# Patient Record
Sex: Male | Born: 1980 | Race: White | Hispanic: No | Marital: Married | Smoking: Current every day smoker
Health system: Southern US, Community
[De-identification: ages and names within clinical notes are randomized; demographics above are authoritative.]

---

## 2019-08-30 ENCOUNTER — Emergency Department (HOSPITAL_COMMUNITY): Payer: 59

## 2019-08-30 ENCOUNTER — Other Ambulatory Visit: Payer: Self-pay

## 2019-08-30 ENCOUNTER — Encounter (HOSPITAL_COMMUNITY): Payer: Self-pay

## 2019-08-30 ENCOUNTER — Emergency Department (HOSPITAL_COMMUNITY)
Admission: EM | Admit: 2019-08-30 | Discharge: 2019-08-30 | Disposition: A | Discharge status: Home / Self Care | Payer: 59 | Attending: Emergency Medicine | Admitting: Emergency Medicine

## 2019-08-30 DIAGNOSIS — Z20822 Contact with and (suspected) exposure to covid-19: Secondary | ICD-10-CM | POA: Diagnosis not present

## 2019-08-30 DIAGNOSIS — F17210 Nicotine dependence, cigarettes, uncomplicated: Secondary | ICD-10-CM | POA: Diagnosis not present

## 2019-08-30 DIAGNOSIS — R079 Chest pain, unspecified: Secondary | ICD-10-CM | POA: Diagnosis present

## 2019-08-30 DIAGNOSIS — R0602 Shortness of breath: Secondary | ICD-10-CM | POA: Diagnosis not present

## 2019-08-30 DIAGNOSIS — R072 Precordial pain: Secondary | ICD-10-CM | POA: Insufficient documentation

## 2019-08-30 LAB — BASIC METABOLIC PANEL
Anion gap: 16 — ABNORMAL HIGH (ref 5–15)
BUN: 22 mg/dL — ABNORMAL HIGH (ref 6–20)
CO2: 20 mmol/L — ABNORMAL LOW (ref 22–32)
Calcium: 9.9 mg/dL (ref 8.9–10.3)
Chloride: 99 mmol/L (ref 98–111)
Creatinine, Ser: 1.43 mg/dL — ABNORMAL HIGH (ref 0.61–1.24)
GFR calc Af Amer: >60 mL/min (ref >60)
GFR calc non Af Amer: >60 mL/min (ref >60)
Glucose, Bld: 95 mg/dL (ref 70–99)
Potassium: 3.1 mmol/L — ABNORMAL LOW (ref 3.5–5.1)
Sodium: 135 mmol/L (ref 135–145)

## 2019-08-30 LAB — TROPONIN I (HIGH SENSITIVITY)
Troponin I (High Sensitivity): 3 ng/L (ref <18)
Troponin I (High Sensitivity): 2 ng/L (ref <18)
Troponin I (High Sensitivity): 3 ng/L (ref <18)

## 2019-08-30 LAB — CBC
HCT: 43.4 % (ref 39.0–52.0)
Hemoglobin: 15.4 g/dL (ref 13.0–17.0)
MCH: 31 pg (ref 26.0–34.0)
MCHC: 35.5 g/dL (ref 30.0–36.0)
MCV: 87.5 fL (ref 80.0–100.0)
Platelets: 322 10*3/uL (ref 150–400)
RBC: 4.96 MIL/uL (ref 4.22–5.81)
RDW: 11.9 % (ref 11.5–15.5)
WBC: 9 10*3/uL (ref 4.0–10.5)
nRBC: 0 % (ref 0.0–0.2)

## 2019-08-30 LAB — D-DIMER, QUANTITATIVE: D-Dimer, Quant: 0.62 ug/mL-FEU — ABNORMAL HIGH (ref 0.00–0.50)

## 2019-08-30 LAB — MAGNESIUM: Magnesium: 1.8 mg/dL (ref 1.7–2.4)

## 2019-08-30 LAB — SARS CORONAVIRUS 2 BY RT PCR (HOSPITAL ORDER, PERFORMED IN ~~LOC~~ HOSPITAL LAB): SARS Coronavirus 2: NEGATIVE

## 2019-08-30 MED ORDER — POTASSIUM CHLORIDE 10 MEQ/100ML IV SOLN
10.0000 meq | INTRAVENOUS | Status: AC
  Administered 2019-08-30 (×2): 10 meq via INTRAVENOUS
  Filled 2019-08-30 (×3): qty 100

## 2019-08-30 MED ORDER — LORAZEPAM 2 MG/ML IJ SOLN
1.0000 mg | Freq: Once | INTRAMUSCULAR | Status: AC
  Administered 2019-08-30: 1 mg via INTRAVENOUS
  Filled 2019-08-30: qty 1

## 2019-08-30 MED ORDER — HYDROXYZINE HCL 25 MG PO TABS: 25.0000 mg | ORAL_TABLET | Freq: Four times a day (QID) | ORAL | 0 refills | Status: AC

## 2019-08-30 MED ORDER — SODIUM CHLORIDE 0.9% FLUSH
3.0000 mL | Freq: Once | INTRAVENOUS | Status: AC
  Administered 2019-08-30: 3 mL via INTRAVENOUS

## 2019-08-30 MED ORDER — MAGNESIUM SULFATE 2 GM/50ML IV SOLN
2.0000 g | Freq: Once | INTRAVENOUS | Status: AC
  Administered 2019-08-30: 2 g via INTRAVENOUS
  Filled 2019-08-30: qty 50

## 2019-08-30 MED ORDER — SODIUM CHLORIDE 0.9 % IV BOLUS
1000.0000 mL | Freq: Once | INTRAVENOUS | Status: AC
  Administered 2019-08-30: 1000 mL via INTRAVENOUS

## 2019-08-30 MED ORDER — LORAZEPAM 1 MG PO TABS: 1.0000 mg | ORAL_TABLET | Freq: Once | ORAL | Status: DC

## 2019-08-30 MED ORDER — NITROGLYCERIN 0.4 MG SL SUBL
0.4000 mg | SUBLINGUAL_TABLET | Freq: Once | SUBLINGUAL | Status: AC
  Administered 2019-08-30: 0.4 mg via SUBLINGUAL
  Filled 2019-08-30: qty 1

## 2019-08-30 MED ORDER — SODIUM CHLORIDE (PF) 0.9 % IJ SOLN
INTRAMUSCULAR | Status: AC
  Filled 2019-08-30: qty 50

## 2019-08-30 MED ORDER — ASPIRIN 81 MG PO CHEW
324.0000 mg | CHEWABLE_TABLET | Freq: Once | ORAL | Status: AC
  Administered 2019-08-30: 324 mg via ORAL
  Filled 2019-08-30: qty 4

## 2019-08-30 MED ORDER — ALBUTEROL SULFATE HFA 108 (90 BASE) MCG/ACT IN AERS
2.0000 | INHALATION_SPRAY | Freq: Once | RESPIRATORY_TRACT | Status: AC
  Administered 2019-08-30: 2 via RESPIRATORY_TRACT
  Filled 2019-08-30: qty 6.7

## 2019-08-30 MED ORDER — IOHEXOL 350 MG/ML SOLN
100.0000 mL | Freq: Once | INTRAVENOUS | Status: AC | PRN
  Administered 2019-08-30: 100 mL via INTRAVENOUS

## 2019-08-30 MED ORDER — FENTANYL CITRATE (PF) 100 MCG/2ML IJ SOLN
50.0000 ug | Freq: Once | INTRAMUSCULAR | Status: AC
  Administered 2019-08-30: 50 ug via INTRAVENOUS
  Filled 2019-08-30: qty 2

## 2019-08-30 MED ORDER — LIDOCAINE VISCOUS HCL 2 % MT SOLN
15.0000 mL | Freq: Once | OROMUCOSAL | Status: AC
  Administered 2019-08-30: 15 mL via ORAL
  Filled 2019-08-30: qty 15

## 2019-08-30 MED ORDER — ALUM & MAG HYDROXIDE-SIMETH 200-200-20 MG/5ML PO SUSP
30.0000 mL | Freq: Once | ORAL | Status: AC
  Administered 2019-08-30: 30 mL via ORAL
  Filled 2019-08-30: qty 30

## 2019-08-30 NOTE — ED Notes (Addendum)
Called lab regarding troponin collected at 11:45 am. Lab said it was not received. I insisted that I sent the lab. After 3 minutes of conversation regarding the labs I send that are not received, representative said let me look in the save rack, and the 11:45 AM troponin was found in the save rack.

## 2019-08-30 NOTE — ED Triage Notes (Signed)
Pt presents with c/o chest pain that started this morning on the way to work. Pt reports he is having a hard time catching his breath and feels heaviness in his chest. Pt also reports trouble swallowing.

## 2019-08-30 NOTE — ED Notes (Signed)
Pt. Aware of urine specimen. Urinal at the bedside. Will collect urine when pt. Voids. Nurse aware. 

## 2019-08-30 NOTE — Discharge Instructions (Signed)
Return for new or worsening symptoms

## 2019-08-30 NOTE — ED Notes (Signed)
Patient transported to CT 

## 2019-08-30 NOTE — ED Provider Notes (Signed)
[facility]-EMERGENCY DEPT Provider Note   CSN: [id number] Arrival date & time: 08/30/19  8850    History Chief Complaint  Patient presents with  . Chest Pain    Alexander Fields is a 39 y.o. male with past medical history significant for tobacco use, anxiety who presents for evaluation of chest pain.  Patient states this morning he woke up and had chest pain.  States occasionally radiate into his back.  States he also had some myalgias and difficulty swallowing.  Takes Prozac for anxiety which she states he did take this morning.  He does use tobacco.  No associated diaphoresis, nausea, vomiting, radiation to left arm, jaw.  He does feel short of breath.  No headache, hemoptysis, unilateral leg swelling, redness, warmth, abdominal pain, diarrhea, dysuria.  No recent surgery, malignancy, immobilization.  No prior history of clotting disorders, aneurysms, PE, dissection.  Rates his pain a 10/10.  Denies of aggravating or relieving factors.  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 NO pleuritic CP.  Denies illicit substance use specifically Cocaine use  History obtained from patient and past medical records. No interpretor was used.   1000: Attempted to contact patients wife Junie Spencer per patients request at [phone] however rang without voicemail answer.  HPI     History reviewed. No pertinent past medical history.  There are no problems to display for this patient.   History reviewed. No pertinent surgical history.     No family history on file.  Social History   Tobacco Use  . Smoking status: Current Every Day Smoker    Packs/day: 0.50    Types: Cigarettes  . Smokeless tobacco: Never Used  Substance Use Topics  . Alcohol use: Never  . Drug use: Never    Home Medications Prior to Admission medications   Medication Sig Start Date End Date Taking? Authorizing Provider  FLUoxetine (PROZAC) 40 MG  capsule Take 80 mg by mouth in the morning. 08/20/19  Yes [provider]  hydrOXYzine (ATARAX/VISTARIL) 25 MG tablet Take 1 tablet (25 mg total) by mouth every 6 (six) hours. 08/30/19   Romar Woodrick A, PA-C    Allergies    Penicillins and Tramadol  Review of Systems   Review of Systems  Constitutional: Negative.   HENT: Negative.   Respiratory: Positive for shortness of breath. Negative for apnea, cough, choking, chest tightness, wheezing and stridor.   Cardiovascular: Positive for chest pain. Negative for palpitations and leg swelling.  Gastrointestinal: Negative.   Genitourinary: Negative.   Musculoskeletal: Negative.   Skin: Negative.   Neurological: Negative.   All other systems reviewed and are negative.  Physical Exam Updated Vital Signs BP 116/74   Pulse 99   Temp 97.6 F (36.4 C)   Resp 17   Ht 5\' 9"  (1.753 m)   Wt 72.1 kg   SpO2 100%   BMI 23.48 kg/m   Physical Exam Vitals and nursing note reviewed.  Constitutional:      General: He is not in acute distress.    Appearance: He is not ill-appearing, toxic-appearing or diaphoretic.     Comments: Appear anxious  HENT:     Head: Normocephalic and atraumatic.     Jaw: There is normal jaw occlusion.     Nose: Nose normal.     Mouth/Throat:     Mouth: Mucous membranes are moist.  Eyes:     Extraocular Movements: Extraocular movements intact.  Neck:  Vascular: No carotid bruit or JVD.     Trachea: Trachea and phonation normal.     Meningeal: Brudzinski's sign and Kernig's sign absent.  Cardiovascular:     Rate and Rhythm: Tachycardia present.     Pulses: Normal pulses.          Radial pulses are 2+ on the right side and 2+ on the left side.       Posterior tibial pulses are 2+ on the right side and 2+ on the left side.     Heart sounds: Normal heart sounds.     Comments: NO murmur Pulmonary:     Effort: Pulmonary effort is normal. Tachypnea present.     Breath sounds: Normal breath sounds and  air entry.     Comments: Intermittent tachypnic. Clear lung sounds, speaks in full sentences without difficulty  Chest:     Chest wall: No deformity, swelling, tenderness, crepitus or edema.     Comments: Equal rise and fall of chest. No crepitus step off or skin changes Abdominal:     General: Bowel sounds are normal. There is no distension.     Palpations: Abdomen is soft.     Tenderness: There is no abdominal tenderness. There is no right CVA tenderness, left CVA tenderness, guarding or rebound.     Comments: Soft non tender without rebound or guarding  Musculoskeletal:        General: Normal range of motion.     Cervical back: Full passive range of motion without pain, normal range of motion and neck supple.     Right lower leg: Normal. No tenderness. No edema.     Left lower leg: Normal. No tenderness. No edema.     Comments: Moves all 4 extremities without difficulty. Compartments soft. Homans sign negative.  Feet:     Right foot:     Skin integrity: Skin integrity normal.     Left foot:     Skin integrity: Skin integrity normal.  Lymphadenopathy:     Cervical: No cervical adenopathy.  Skin:    General: Skin is warm.     Capillary Refill: Capillary refill takes less than 2 seconds.     Findings: No erythema.     Comments: Brisk cap refill. No edema, erythema, warmth  Neurological:     General: No focal deficit present.     Mental Status: He is alert and oriented to person, place, and time.     Cranial Nerves: Cranial nerves are intact.     Sensory: Sensation is intact.     Motor: Motor function is intact.     Coordination: Coordination is intact.     Gait: Gait is intact.     Deep Tendon Reflexes: Reflexes are normal and symmetric.     Comments: Ambulatory without ataxic gait. CN 2-12 grossly intact. AxO x 4     ED Results / Procedures / Treatments   Labs (all labs ordered are listed, but only abnormal results are displayed) Labs Reviewed  BASIC METABOLIC PANEL -  Abnormal; Notable for the following components:      Result Value   Potassium 3.1 (*)    CO2 20 (*)    BUN 22 (*)    Creatinine, Ser 1.43 (*)    Anion gap 16 (*)    All other components within normal limits  D-DIMER, QUANTITATIVE (NOT AT [facility]) - Abnormal; Notable for the following components:   D-Dimer, Quant 0.62 (*)    All other components within  normal limits  SARS CORONAVIRUS 2 BY RT PCR (HOSPITAL ORDER, PERFORMED IN [facility])  CBC  MAGNESIUM  RAPID URINE DRUG SCREEN, HOSP PERFORMED  TROPONIN I (HIGH SENSITIVITY)  TROPONIN I (HIGH SENSITIVITY)  TROPONIN I (HIGH SENSITIVITY)  TROPONIN I (HIGH SENSITIVITY)    EKG EKG Interpretation  Date/Time:  Sunday August 30 2019 11:40:38 EDT Ventricular Rate:  75 PR Interval:    QRS Duration: 107 QT Interval:  447 QTC Calculation: 500 R Axis:   46 Text Interpretation: Sinus rhythm RSR' in V1 or V2, right VCD or RVH Borderline prolonged QT interval Since last tracing of earlier today rate slower, ST segments normal, and  QT is shorter Otherwise no significant change Confirmed by Mancel Bale [phone]) on 08/30/2019 12:23:59 PM   Radiology DG Chest 2 View  Result Date: 08/30/2019 CLINICAL DATA:  Onset shortness of breath and chest pain extending into back since this morning. EXAM: CHEST - 2 VIEW COMPARISON:  03/07/2017 FINDINGS: Lungs are clear. Heart size and mediastinal contours are within normal limits. No effusion.  No pleural effusion. Visualized bones unremarkable. IMPRESSION: No acute cardiopulmonary disease. Electronically Signed   By: Corlis Leak M.D.   On: 08/30/2019 09:30   CT Angio Chest/Abd/Pel for Dissection W and/or W/WO  Result Date: 08/30/2019 CLINICAL DATA:  Severe left-sided chest pain and shortness of breath, aortic dissection suspected EXAM: CT ANGIOGRAPHY CHEST, ABDOMEN AND PELVIS TECHNIQUE: Non-contrast CT of the chest was initially obtained. Multidetector CT imaging through the chest, abdomen and pelvis  was performed using the standard protocol during bolus administration of intravenous contrast. Multiplanar reconstructed images and MIPs were obtained and reviewed to evaluate the vascular anatomy. CONTRAST:  OMNIPAQUE IOHEXOL 350 MG/ML SOLN COMPARISON:  None. FINDINGS: CTA CHEST FINDINGS Cardiovascular: Preferential opacification of the thoracic aorta. Normal contour and caliber of the thoracic aorta. No evidence of aneurysm, dissection, or other acute aortic pathology. Normal heart size. No pericardial effusion. Mediastinum/Nodes: No enlarged mediastinal, hilar, or axillary lymph nodes. Thyroid gland, trachea, and esophagus demonstrate no significant findings. Lungs/Pleura: Lungs are clear. No pleural effusion or pneumothorax. Musculoskeletal: No chest wall abnormality. No acute or significant osseous findings. Review of the MIP images confirms the above findings. CTA ABDOMEN AND PELVIS FINDINGS VASCULAR Normal contour and caliber of the abdominal aorta. No evidence of aneurysm, dissection, or other acute aortic pathology. 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 Standard branching pattern of the abdominal aorta with solitary bilateral renal arteries. Review of the MIP images confirms the above findings. NON-VASCULAR Hepatobiliary: No solid liver abnormality is seen. No gallstones, gallbladder wall thickening, or biliary dilatation. Pancreas: Unremarkable. No pancreatic ductal dilatation or surrounding inflammatory changes. Spleen: Normal in size without significant abnormality. Adrenals/Urinary Tract: Adrenal glands are unremarkable. Kidneys are normal, without renal calculi, solid lesion, or hydronephrosis. Bladder is unremarkable. Stomach/Bowel: Stomach is within normal limits. Appendix appears normal. No evidence of bowel wall thickening, distention, or inflammatory changes. Lymphatic: No enlarged abdominal or pelvic lymph nodes. Reproductive: No mass or other significant abnormality.  Other: No abdominal wall hernia or abnormality. No abdominopelvic ascites. Musculoskeletal: No acute or significant osseous findings. Review of the MIP images confirms the above findings. IMPRESSION: 1. Normal contour and caliber of the thoracic and abdominal aorta. No evidence of aneurysm, dissection, or other acute aortic pathology. 2. 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 Aortic Atherosclerosis (ICD10-I70.0). Electronically Signed   By: Lauralyn Primes M.D.   On: 08/30/2019 10:55  Procedures Procedures (including critical care time)  Medications Ordered in ED Medications  sodium chloride (PF) 0.9 % injection (has no administration in time range)  sodium chloride flush (NS) 0.9 % injection 3 mL (3 mLs Intravenous Given 08/30/19 0943)  aspirin chewable tablet 324 mg (324 mg Oral Given 08/30/19 0943)  sodium chloride 0.9 % bolus 1,000 mL (0 mLs Intravenous Stopped 08/30/19 1054)  LORazepam (ATIVAN) injection 1 mg (1 mg Intravenous Given 08/30/19 1028)  fentaNYL (SUBLIMAZE) injection 50 mcg (50 mcg Intravenous Given 08/30/19 1027)  iohexol (OMNIPAQUE) 350 MG/ML injection 100 mL (100 mLs Intravenous Contrast Given 08/30/19 1030)  potassium chloride 10 mEq in 100 mL IVPB (0 mEq Intravenous Stopped 08/30/19 1315)  magnesium sulfate IVPB 2 g 50 mL (0 g Intravenous Stopped 08/30/19 1212)  albuterol (VENTOLIN HFA) 108 (90 Base) MCG/ACT inhaler 2 puff (2 puffs Inhalation Given 08/30/19 1214)  LORazepam (ATIVAN) injection 1 mg (1 mg Intravenous Given 08/30/19 1214)  nitroGLYCERIN (NITROSTAT) SL tablet 0.4 mg (0.4 mg Sublingual Given 08/30/19 1214)  alum & mag hydroxide-simeth (MAALOX/MYLANTA) 200-200-20 MG/5ML suspension 30 mL (30 mLs Oral Given 08/30/19 1310)    And  lidocaine (XYLOCAINE) 2 % viscous mouth solution 15 mL (15 mLs Oral Given 08/30/19 1310)   ED Course  I have reviewed the triage vital signs and the nursing notes.  Pertinent labs & imaging results  that were available during my care of the patient were reviewed by me and considered in my medical decision making (see chart for details).   39 year old male presents for evaluation of chest pain which began this morning.  He does appear anxious and has tachycardia, tachypnea however no hypoxia.  Occasionally radiate into back.  No new lateral leg swelling, redness or warmth.  Denna Haggard' sign is negative, clinically patient does not have evidence of DVT.  No upper respiratory complaints prior to this to suggest infectious process.  No recent injury or trauma.  No history of clotting disorders, PE, dissection or aneurysm.  Labs and imaging personally reviewed and interpreted: CBC without leukocytosis BMP with Hypokalemia at 3.1, will replace, Creatinine 1.43 no prior to compare, Anion gap 16 Ddimer 0.62 Trop 3, repeat 2 however not a true delta as this was collected 1 hours after initial. Nursing notified of recollect. Mag 1.8 however given hypokalemia and Prolonged Qtc on EKG will add on supplementation with potassium UDS pending >>> refuses to provide UA COVID pending DG chest without acute cardiopulmonary disease CTA Chest, Ab/Pelvis for dissection WO acute findings EKG without STEMI, repeat EKG without STEMI, no ST changes, Qtc improved from prior  Patient reassessed. Patient hyperventilating, still has pain. Will add on additional meds  Patient reassessed. No CP. RR 18. Lungs clear after albuterol. Patient does seem to have anxiety which seem to be contributing to his discomfort as he now says he has belching reflux and some generalized abd pain. Discussed CT findings. Repeat Abd exam benign. Low suspicion for acute surgical abdomen. DDimer was mildly evaluated previously however no large PE on Dissection study. Will give GI cocktail. Unfortunately lab has lost patients last 2 delta trop blood draws. Nursing has called down and they were able to located the 1145 delta trop which was flat at  3.  Patient appear comfortable. No current complaints. Does get anxious when people enter the room. No further tachycardia or tachypnia. No respiratory distress.  Patient reassessed, sleeping on reevaluation however arousable to voice.  He is ambulatory in room without difficult. Patient without any  current complaints.  He is ambulatory without any hypoxia, tachypnea.  Patient with reassuring work-up here in ED.  Low suspicion for PE, dissection, ACS, cardiac tamponade, pneumothorax, bacterial infectious process, borhaave. Symptoms resolved after additional Ativan and GI cocktail. Likly multifactorial in nature. I have attempted to contact Wife x 2 however unable to reach her. Patient dc home in stable condition.   Patient is to be discharged with recommendation to follow up with PCP in regards to today's hospital visit. Chest pain is not likely of cardiac or pulmonary etiology d/t presentation, PERC negative, VSS, no tracheal deviation, no JVD or new murmur, RRR, breath sounds equal bilaterally, EKG without acute abnormalities, negative troponin, and negative CXR. Pt has been advised to return to the ED if CP becomes exertional, associated with diaphoresis or nausea, radiates to left jaw/arm, worsens or becomes concerning in any way. Pt appears reliable for follow up and is agreeable to discharge.   The patient has been appropriately medically screened and/or stabilized in the ED. I have low suspicion for any other emergent medical condition which would require further screening, evaluation or treatment in the ED or require inpatient management.  Patient is hemodynamically stable and in no acute distress.  Patient able to ambulate in department prior to ED.  Evaluation does not show acute pathology that would require ongoing or additional emergent interventions while in the emergency department or further inpatient treatment.  I have discussed the diagnosis with the patient and answered all questions.   Pain is been managed while in the emergency department and patient has no further complaints prior to discharge.  Patient is comfortable with plan discussed in room and is stable for discharge at this time.  I have discussed strict return precautions for returning to the emergency department.  Patient was encouraged to follow-up with PCP/specialist refer to at discharge.  Patient seen and evaluated by attending Dr. Effie Shy who agrees with above treatment, plan and disposition.    MDM Rules/Calculators/A&P                           Final Clinical Impression(s) / ED Diagnoses Final diagnoses:  Precordial pain    Rx / DC Orders ED Discharge Orders         Ordered    hydrOXYzine (ATARAX/VISTARIL) 25 MG tablet  Every 6 hours     Discontinue  Reprint     08/30/19 1352           Clevester Helzer A, PA-C 08/30/19 1418    Mancel Bale, MD 08/30/19 1502

## 2019-08-30 NOTE — ED Provider Notes (Signed)
  Face-to-face evaluation   History: He presents for evaluation of sudden onset of center lower chest pain, as he was starting to work today.  He works as a Games developer.  On his way to work, while driving, he noticed some trouble breathing.  He denies fever, chills, cough, nausea vomiting.  He is a cigarette smoker.  Physical exam: Alert, anxious, tachypneic and tachycardic.  Heart-tachycardic without murmur.  Lungs clear anteriorly.  There is no increased work of breathing.  Medical screening examination/treatment/procedure(s) were conducted as a shared visit with non-physician practitioner(s) and myself.  I personally evaluated the patient during the encounter    Mancel Bale, MD 08/30/19 [id number]

## 2021-03-13 ENCOUNTER — Emergency Department (HOSPITAL_COMMUNITY): Payer: Self-pay

## 2021-03-13 ENCOUNTER — Emergency Department (HOSPITAL_COMMUNITY)
Admission: EM | Admit: 2021-03-13 | Discharge: 2021-03-13 | Disposition: A | Discharge status: Home / Self Care | Payer: Self-pay | Attending: Emergency Medicine | Admitting: Emergency Medicine

## 2021-03-13 DIAGNOSIS — R1011 Right upper quadrant pain: Secondary | ICD-10-CM | POA: Insufficient documentation

## 2021-03-13 DIAGNOSIS — R1032 Left lower quadrant pain: Secondary | ICD-10-CM | POA: Insufficient documentation

## 2021-03-13 LAB — URINALYSIS, ROUTINE W REFLEX MICROSCOPIC
Bilirubin Urine: NEGATIVE
Glucose, UA: NEGATIVE mg/dL
Ketones, ur: NEGATIVE mg/dL
Leukocytes,Ua: NEGATIVE
Nitrite: NEGATIVE
Protein, ur: NEGATIVE mg/dL
Specific Gravity, Urine: 1.025 (ref 1.005–1.030)
pH: 6.5 (ref 5.0–8.0)

## 2021-03-13 LAB — CBC
HCT: 47.1 % (ref 39.0–52.0)
Hemoglobin: 16.5 g/dL (ref 13.0–17.0)
MCH: 31.9 pg (ref 26.0–34.0)
MCHC: 35 g/dL (ref 30.0–36.0)
MCV: 91.1 fL (ref 80.0–100.0)
Platelets: 275 10*3/uL (ref 150–400)
RBC: 5.17 MIL/uL (ref 4.22–5.81)
RDW: 11.6 % (ref 11.5–15.5)
WBC: 7 10*3/uL (ref 4.0–10.5)
nRBC: 0 % (ref 0.0–0.2)

## 2021-03-13 LAB — COMPREHENSIVE METABOLIC PANEL
ALT: 23 U/L (ref 0–44)
AST: 21 U/L (ref 15–41)
Albumin: 4.3 g/dL (ref 3.5–5.0)
Alkaline Phosphatase: 76 U/L (ref 38–126)
Anion gap: 8 (ref 5–15)
BUN: 12 mg/dL (ref 6–20)
CO2: 27 mmol/L (ref 22–32)
Calcium: 9.6 mg/dL (ref 8.9–10.3)
Chloride: 103 mmol/L (ref 98–111)
Creatinine, Ser: 1.04 mg/dL (ref 0.61–1.24)
GFR, Estimated: >60 mL/min (ref >60)
Glucose, Bld: 101 mg/dL — ABNORMAL HIGH (ref 70–99)
Potassium: 4.6 mmol/L (ref 3.5–5.1)
Sodium: 138 mmol/L (ref 135–145)
Total Bilirubin: 0.3 mg/dL (ref 0.3–1.2)
Total Protein: 7.2 g/dL (ref 6.5–8.1)

## 2021-03-13 LAB — URINALYSIS, MICROSCOPIC (REFLEX): Squamous Epithelial / HPF: NONE SEEN (ref 0–5)

## 2021-03-13 LAB — LIPASE, BLOOD: Lipase: 30 U/L (ref 11–51)

## 2021-03-13 MED ORDER — FENTANYL CITRATE PF 50 MCG/ML IJ SOSY
50.0000 ug | PREFILLED_SYRINGE | Freq: Once | INTRAMUSCULAR | Status: AC
  Administered 2021-03-13: 50 ug via INTRAVENOUS
  Filled 2021-03-13: qty 1

## 2021-03-13 MED ORDER — NAPROXEN 375 MG PO TABS: 375.0000 mg | ORAL_TABLET | Freq: Two times a day (BID) | ORAL | 0 refills | Status: AC

## 2021-03-13 MED ORDER — HYDROMORPHONE HCL 1 MG/ML IJ SOLN
1.0000 mg | Freq: Once | INTRAMUSCULAR | Status: AC
  Administered 2021-03-13: 1 mg via INTRAVENOUS
  Filled 2021-03-13: qty 1

## 2021-03-13 MED ORDER — SODIUM CHLORIDE 0.9 % IV BOLUS
1000.0000 mL | Freq: Once | INTRAVENOUS | Status: AC
  Administered 2021-03-13: 1000 mL via INTRAVENOUS

## 2021-03-13 MED ORDER — ONDANSETRON 4 MG PO TBDP
4.0000 mg | ORAL_TABLET | Freq: Once | ORAL | Status: AC
  Administered 2021-03-13: 4 mg via ORAL
  Filled 2021-03-13: qty 1

## 2021-03-13 MED ORDER — KETOROLAC TROMETHAMINE 15 MG/ML IJ SOLN
15.0000 mg | Freq: Once | INTRAMUSCULAR | Status: AC
  Administered 2021-03-13: 15 mg via INTRAVENOUS
  Filled 2021-03-13: qty 1

## 2021-03-13 MED ORDER — IOHEXOL 300 MG/ML  SOLN
100.0000 mL | Freq: Once | INTRAMUSCULAR | Status: AC | PRN
  Administered 2021-03-13: 100 mL via INTRAVENOUS

## 2021-03-13 NOTE — ED Provider Notes (Signed)
[facility] EMERGENCY DEPARTMENT Provider Note   CSN: [id number] Arrival date & time: 03/13/21  1025     History  Chief Complaint  Patient presents with   Abdominal Pain   Nausea    Alexander Fields is a 41 y.o. male.  HPI 41 year old male presents with abdominal pain and says it feels like his gallbladder.  Happened last night after eating dinner, which he states consisted of sweet potatoes.  Pain seem to go away last night but then came back again around 7 AM.  Years ago in [place] he was told that he had a gallbladder problem from her similar pain and was told that if it happened again he would need to have surgery to have it removed.  Rates his pain as a 9.  He denies any fevers.  Home Medications Prior to Admission medications   Medication Sig Start Date End Date Taking? Authorizing Provider  acetaminophen (TYLENOL) 325 MG tablet Take 650 mg by mouth every 6 (six) hours as needed for mild pain or moderate pain.   Yes [provider]  Aspirin-Acetaminophen-Caffeine (GOODYS EXTRA STRENGTH PO) Take 1 packet by mouth as needed (moderate pain).   Yes [provider]  naproxen (NAPROSYN) 375 MG tablet Take 1 tablet (375 mg total) by mouth 2 (two) times daily. 03/13/21  Yes Pricilla Loveless, MD  hydrOXYzine (ATARAX/VISTARIL) 25 MG tablet Take 1 tablet (25 mg total) by mouth every 6 (six) hours. Patient not taking: Reported on 03/13/2021 08/30/19   Henderly, Britni A, PA-C      Allergies    Penicillins and Tramadol    Review of Systems   Review of Systems  Constitutional:  Negative for fever.  Gastrointestinal:  Positive for abdominal pain. Negative for diarrhea and vomiting.   Physical Exam Updated Vital Signs BP (!) 126/96    Pulse 69    Temp 98 F (36.7 C) (Oral)    Resp 15    SpO2 99%  Physical Exam Vitals and nursing note reviewed.  Constitutional:      Appearance: He is well-developed.  HENT:     Head: Normocephalic and atraumatic.   Cardiovascular:     Rate and Rhythm: Normal rate and regular rhythm.     Heart sounds: Normal heart sounds.  Pulmonary:     Effort: Pulmonary effort is normal.     Breath sounds: Normal breath sounds.  Chest:     Chest wall: No tenderness.  Abdominal:     Palpations: Abdomen is soft.     Tenderness: There is abdominal tenderness (worst tenderness appears to be RUQ/R mid axillary flank) in the right upper quadrant, suprapubic area and left lower quadrant.  Skin:    General: Skin is warm and dry.  Neurological:     Mental Status: He is alert.    ED Results / Procedures / Treatments   Labs (all labs ordered are listed, but only abnormal results are displayed) Labs Reviewed  COMPREHENSIVE METABOLIC PANEL - Abnormal; Notable for the following components:      Result Value   Glucose, Bld 101 (*)    All other components within normal limits  URINALYSIS, ROUTINE W REFLEX MICROSCOPIC - Abnormal; Notable for the following components:   Hgb urine dipstick TRACE (*)    All other components within normal limits  URINALYSIS, MICROSCOPIC (REFLEX) - Abnormal; Notable for the following components:   Bacteria, UA RARE (*)    All other components within normal limits  LIPASE, BLOOD  CBC    EKG None  Radiology CT ABDOMEN PELVIS W CONTRAST  Result Date: 03/13/2021 CLINICAL DATA:  Acute right upper quadrant abdominal pain. EXAM: CT ABDOMEN AND PELVIS WITH CONTRAST TECHNIQUE: Multidetector CT imaging of the abdomen and pelvis was performed using the standard protocol following bolus administration of intravenous contrast. RADIATION DOSE REDUCTION: This exam was performed according to the departmental dose-optimization program which includes automated exposure control, adjustment of the mA and/or kV according to patient size and/or use of iterative reconstruction technique. CONTRAST:  OMNIPAQUE IOHEXOL 300 MG/ML  SOLN COMPARISON:  Same day abdominal ultrasound and CT August 30, 2019 FINDINGS:  Lower chest: No acute abnormality. Hepatobiliary: Subcentimeter hypodense lesions in the left lobe of the liver are technically too small to accurately characterize but statistically likely to reflect a benign etiology such as cysts or hemangiomas. Gallbladder appears normal without cholelithiasis or gallbladder wall thickening. No biliary ductal dilation. Pancreas: No pancreatic ductal dilation or evidence of acute inflammation. Spleen: Normal in size without focal abnormality. Adrenals/Urinary Tract: Bilateral adrenal glands appear normal. No hydronephrosis. Kidneys demonstrate symmetric enhancement. Urinary bladder is unremarkable. Stomach/Bowel: No enteric contrast was administered. Stomach is unremarkable for degree of distension. No pathologic dilation of small or large bowel. The appendix and terminal ileum appear normal. No evidence of acute bowel inflammation. Vascular/Lymphatic: Aortic atherosclerosis without abdominal aortic aneurysm. Reproductive: Prostate is unremarkable. Other: No abdominal wall hernia or abnormality. No abdominopelvic ascites. Musculoskeletal: No acute or significant osseous findings. IMPRESSION: 1. No acute abnormality in the abdomen or pelvis. 2.  Aortic Atherosclerosis (ICD10-I70.0). Electronically Signed   By: Maudry Mayhew M.D.   On: 03/13/2021 13:34   US Abdomen Limited RUQ (LIVER/GB)  Result Date: 03/13/2021 CLINICAL DATA:  Right upper quadrant abdominal pain. EXAM: ULTRASOUND ABDOMEN LIMITED RIGHT UPPER QUADRANT COMPARISON:  Abdominal CTA 08/30/2019 FINDINGS: Gallbladder: No gallstones or gallbladder wall thickening. Sonographer indicates the presence of a sonographic Murphy sign. Common bile duct: Diameter: 6 mm.  No intrahepatic biliary dilatation. Liver: There is a small cyst in the left lobe measuring 1.3 cm. Increased hepatic echogenicity, likely due to steatosis with probable sparing near the gallbladder. No suspicious liver lesion identified. Portal vein is patent  on color Doppler imaging with normal direction of blood flow towards the liver. Other: None. IMPRESSION: 1. The gallbladder appears normal, without wall thickening or cholelithiasis. The sonographer indicates the presence of a sonographic Murphy sign, nonspecific. 2. No biliary dilatation. 3. Probable mild hepatic steatosis. Electronically Signed   By: Carey Bullocks M.D.   On: 03/13/2021 11:37    Procedures Procedures    Medications Ordered in ED Medications  ondansetron (ZOFRAN-ODT) disintegrating tablet 4 mg (4 mg Oral Given 03/13/21 1109)  fentaNYL (SUBLIMAZE) injection 50 mcg (50 mcg Intravenous Given 03/13/21 1109)  sodium chloride 0.9 % bolus 1,000 mL (0 mLs Intravenous Stopped 03/13/21 1237)  HYDROmorphone (DILAUDID) injection 1 mg (1 mg Intravenous Given 03/13/21 1237)  iohexol (OMNIPAQUE) 300 MG/ML solution 100 mL (100 mLs Intravenous Contrast Given 03/13/21 1321)  ketorolac (TORADOL) 15 MG/ML injection 15 mg (15 mg Intravenous Given 03/13/21 1551)    ED Course/ Medical Decision Making/ A&P                           Medical Decision Making Amount and/or Complexity of Data Reviewed Radiology: ordered.  Risk Prescription drug management.   Patient's pain is of unclear etiology. RUQ [place] images personally reviewed, no  cholecystitis or obvious stones. Labs interpreted, are benign with normal WBC, normal lipase and LFTs. There is trace blood in the urine, and some pain from suprapubic area to right side, so perhaps there is an occult kidney stone. However, CT shows no obvious hydro or stone. Given no clear cause, I think he is stable for d/c. He was given IV narcotics, and just prior to discharge will be given IV toradol. Will treat with NSAIDs. D/c home with return precautions.          Final Clinical Impression(s) / ED Diagnoses Final diagnoses:  RUQ pain    Rx / DC Orders ED Discharge Orders          Ordered    naproxen (NAPROSYN) 375 MG tablet  2 times daily        03/13/21  1554              Pricilla Loveless, MD 03/13/21 1743

## 2021-03-13 NOTE — Discharge Instructions (Addendum)
If you develop worsening, continued, or recurrent abdominal pain, uncontrolled vomiting, fever, chest or back pain, or any other new/concerning symptoms then return to the ER for evaluation.   You are being prescribed Naproxen. Do NOT take Ibuprofen/Advil/Aleve/Motrin/Goody Powders/BC powders/Meloxicam/Diclofenac/Indomethacin and other Nonsteroidal anti-inflammatory medications. You may still take Tylenol.

## 2021-03-13 NOTE — ED Provider Triage Note (Signed)
Emergency Medicine Provider Triage Evaluation Note  Eluzer Howdeshell , a 41 y.o. male  was evaluated in triage.  Pt complains of a "gallbladder attack".  Patient states that he started feeling some abdominal pain yesterday, and has became significantly worse while he was at work today.  He is complaining of right upper quadrant pain that radiates to his back and his right shoulder.  He was told that if he ever had a gallbladder attack again, to present to the emergency department as he will likely need surgery.    Review of Systems  Positive: Abdominal pain, nausea, chills Negative: Fever, diarrhea, vomiting  Physical Exam  BP (!) 162/114 (BP Location: Right Arm)    Pulse 97    Temp 98 F (36.7 C) (Oral)    Resp 17    SpO2 98%  Gen:   Awake, no distress   Resp:  Normal effort  MSK:   Moves extremities without difficulty  Other:    Medical Decision Making  Medically screening exam initiated at 10:39 AM.  Appropriate orders placed.  Tad Fancher was informed that the remainder of the evaluation will be completed by another provider, this initial triage assessment does not replace that evaluation, and the importance of remaining in the ED until their evaluation is complete.  Will obtain labs and imaging   [place] Stegemann T, PA-C 03/13/21 1039

## 2021-03-13 NOTE — ED Triage Notes (Signed)
Pt. Stated, I think Im having a gallbladder attack. My right side and to my back started hurting last night . Having some nausea.

## 2021-04-14 IMAGING — CT CT ANGIO CHEST-ABD-PELV FOR DISSECTION W/ AND WO/W CM
2 of 7 series · 14 of 46 positions shown, 16 images · non-contrast
Comparison: None.

CLINICAL DATA: Severe left-sided chest pain and shortness of
breath, aortic dissection suspected

EXAM:
CT ANGIOGRAPHY CHEST, ABDOMEN AND PELVIS
TECHNIQUE: Non-contrast CT of the chest was initially obtained.

[Series 6: axial arterial · axial · arterial · 0.77mm/px · z∈[-644,-56]mm · 11 of 226 slices shown, 13 images]
[im 15/226  soft-tissue]
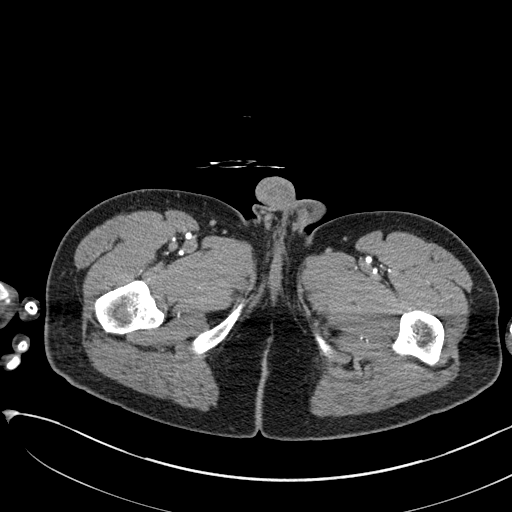
[im 15/226  bone]
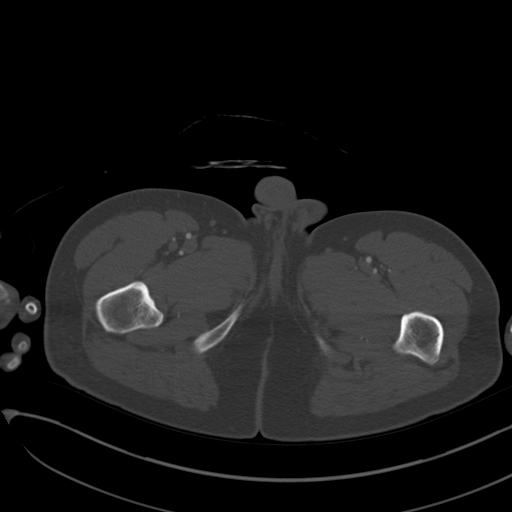
[im 43/226  soft-tissue]
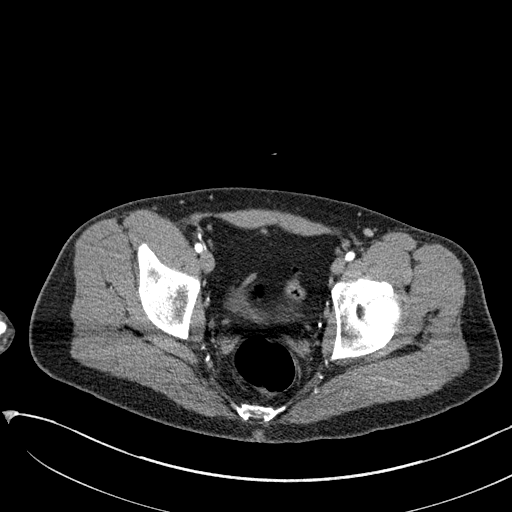
[im 57/226  soft-tissue]
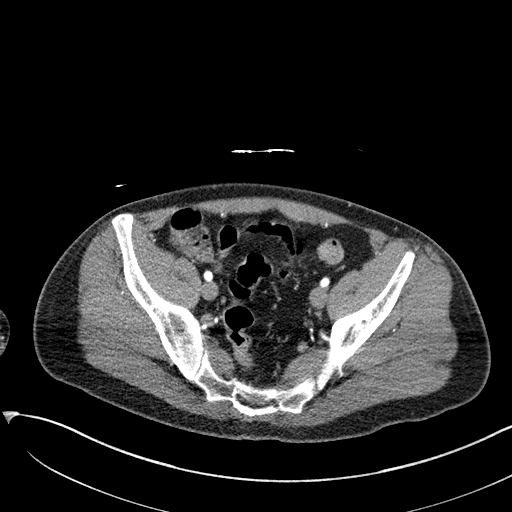
[im 71/226  soft-tissue]
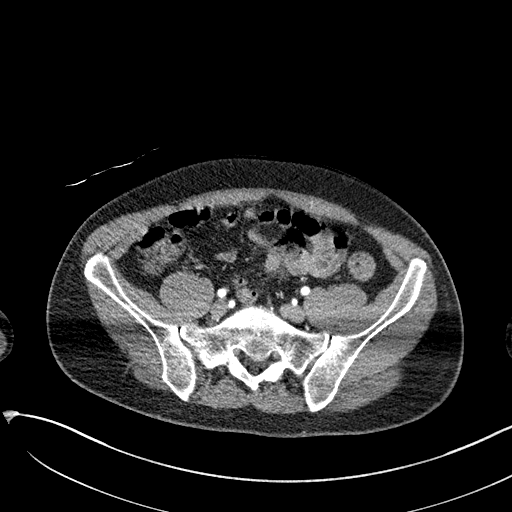
[im 99/226  soft-tissue]
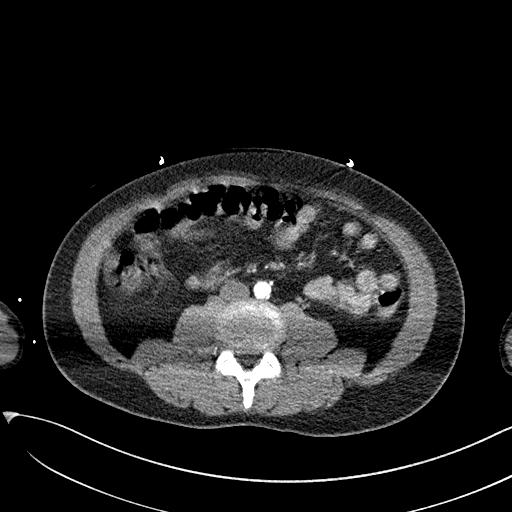
[im 113/226  soft-tissue]
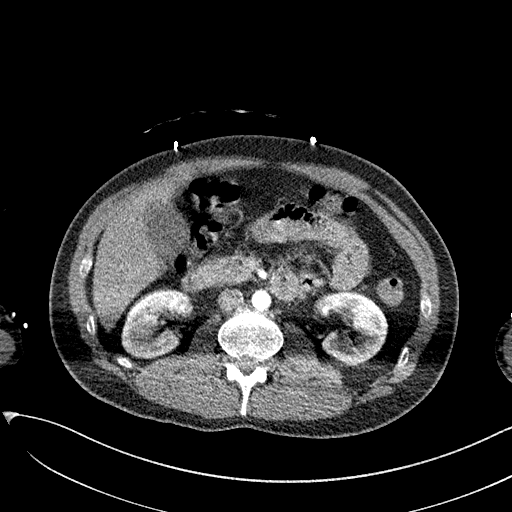
[im 127/226  soft-tissue]
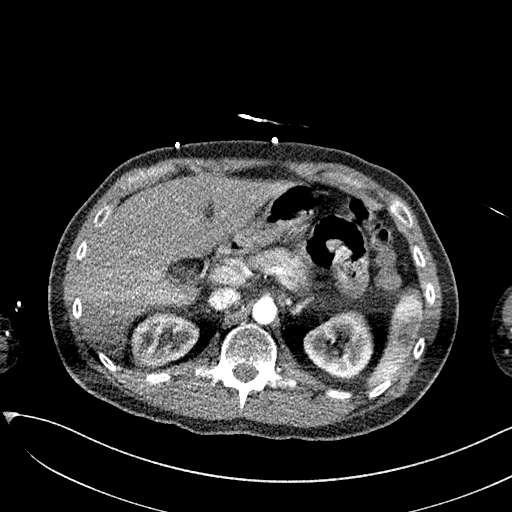
[im 155/226  soft-tissue]
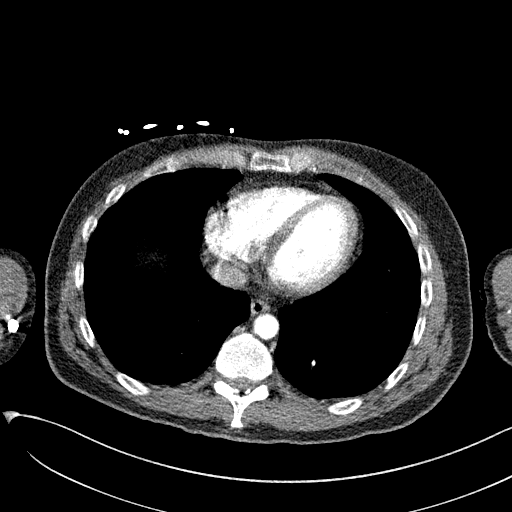
[im 169/226  soft-tissue]
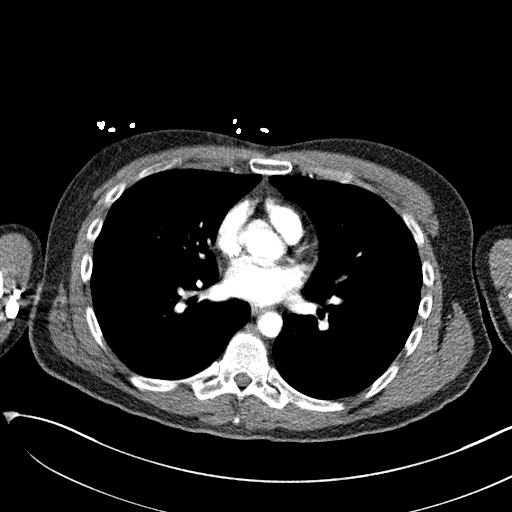
[im 169/226  bone]
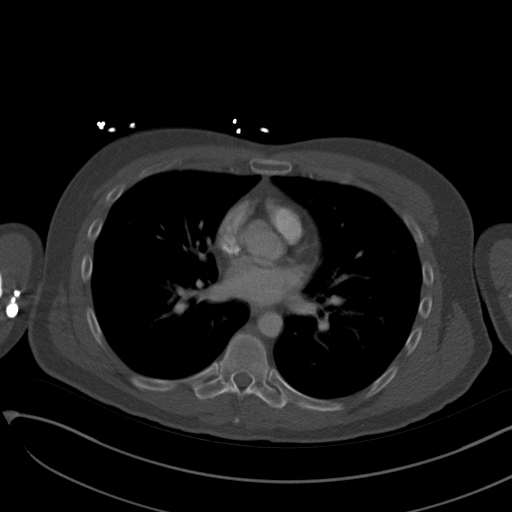
[im 183/226  soft-tissue]
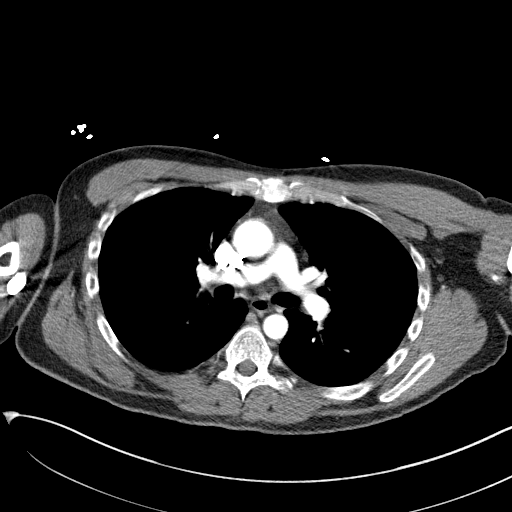
[im 211/226  soft-tissue]
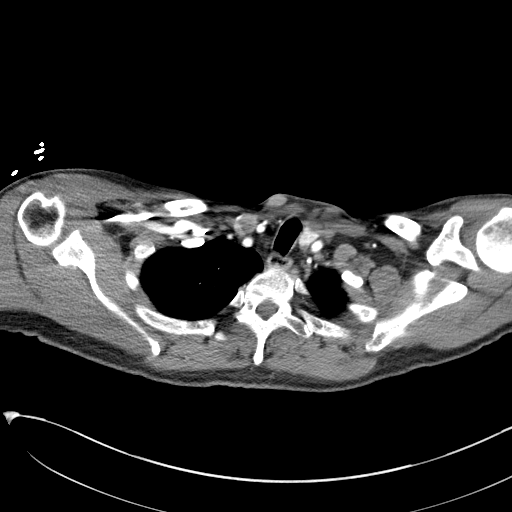

[Series 7: coronals · coronal · 0.75mm/px · 3 of 115 slices shown]
[im 29/115  soft-tissue]
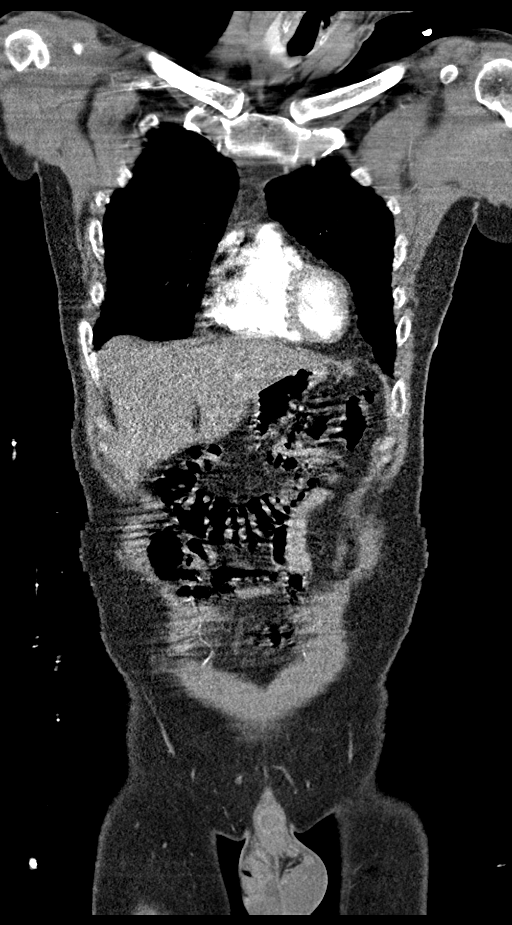
[im 58/115  soft-tissue]
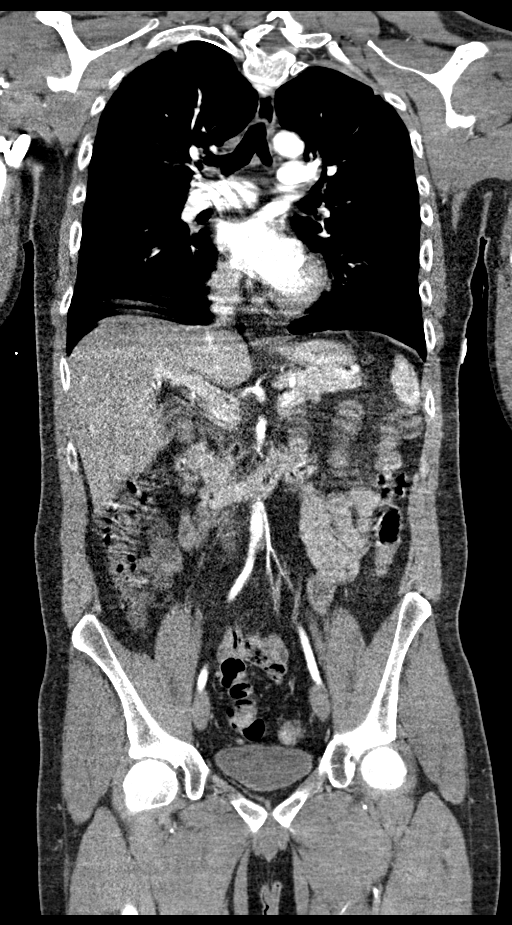
[im 86/115  soft-tissue]
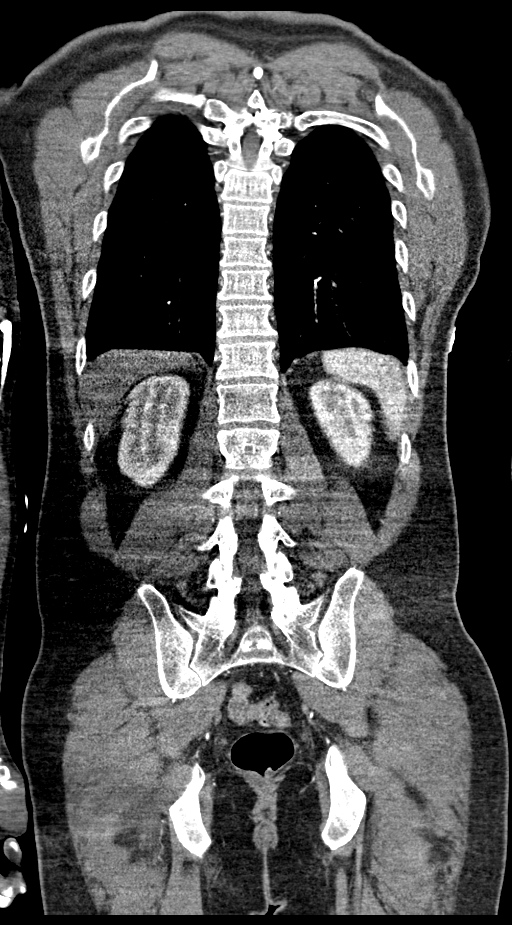

[14 of 46 positions shown; findings below may reference images not displayed]

Multidetector CT imaging through the chest, abdomen and pelvis was
performed using the standard protocol during bolus administration of
intravenous contrast. Multiplanar reconstructed images and MIPs were
obtained and reviewed to evaluate the vascular anatomy.

CONTRAST:  100mL OMNIPAQUE IOHEXOL 350 MG/ML SOLN
FINDINGS: CTA CHEST FINDINGS

Cardiovascular: Preferential opacification of the thoracic aorta.
Normal contour and caliber of the thoracic aorta. No evidence of
aneurysm, dissection, or other acute aortic pathology. Normal heart
size. No pericardial effusion.

Mediastinum/Nodes: No enlarged mediastinal, hilar, or axillary lymph
nodes. Thyroid gland, trachea, and esophagus demonstrate no
significant findings.

Lungs/Pleura: Lungs are clear. No pleural effusion or pneumothorax.

Musculoskeletal: No chest wall abnormality. No acute or significant
osseous findings.

Review of the MIP images confirms the above findings.

CTA ABDOMEN AND PELVIS FINDINGS

VASCULAR

Normal contour and caliber of the abdominal aorta. No evidence of
aneurysm, dissection, or other acute aortic pathology. Mixed
calcific atherosclerosis of the lower abdominal aorta. Standard
branching pattern of the abdominal aorta with solitary bilateral
renal arteries.

Review of the MIP images confirms the above findings.

NON-VASCULAR

Hepatobiliary: No solid liver abnormality is seen. No gallstones,
gallbladder wall thickening, or biliary dilatation.

Pancreas: Unremarkable. No pancreatic ductal dilatation or
surrounding inflammatory changes.

Spleen: Normal in size without significant abnormality.

Adrenals/Urinary Tract: Adrenal glands are unremarkable. Kidneys are
normal, without renal calculi, solid lesion, or hydronephrosis.
Bladder is unremarkable.

Stomach/Bowel: Stomach is within normal limits. Appendix appears
normal. No evidence of bowel wall thickening, distention, or
inflammatory changes.

Lymphatic: No enlarged abdominal or pelvic lymph nodes.

Reproductive: No mass or other significant abnormality.

Other: No abdominal wall hernia or abnormality. No abdominopelvic
ascites.

Musculoskeletal: No acute or significant osseous findings.

Review of the MIP images confirms the above findings.
IMPRESSION: 1. Normal contour and caliber of the thoracic and abdominal aorta.
No evidence of aneurysm, dissection, or other acute aortic
pathology.

2. Mixed calcific atherosclerosis of the lower abdominal aorta,
advanced for patient age. Aortic Atherosclerosis ([id number]).

## 2022-10-27 IMAGING — CT CT ABD-PELV W/ CM
2 of 4 series · 16 of 46 positions shown, 18 images · IV contrast (agent unspecified)
Comparison: Same day abdominal ultrasound and CT August 30, 2019

CLINICAL DATA: Acute right upper quadrant abdominal pain.

EXAM:
CT ABDOMEN AND PELVIS WITH CONTRAST
TECHNIQUE: Multidetector CT imaging of the abdomen and pelvis was performed
using the standard protocol following bolus administration of
intravenous contrast.

[Series 3: abd/ pelvis 5.0 i30f 2 · axial · 0.85mm/px · z∈[+1006,+1450]mm · 13 of 97 slices shown, 15 images]
[im 4/97  soft-tissue]
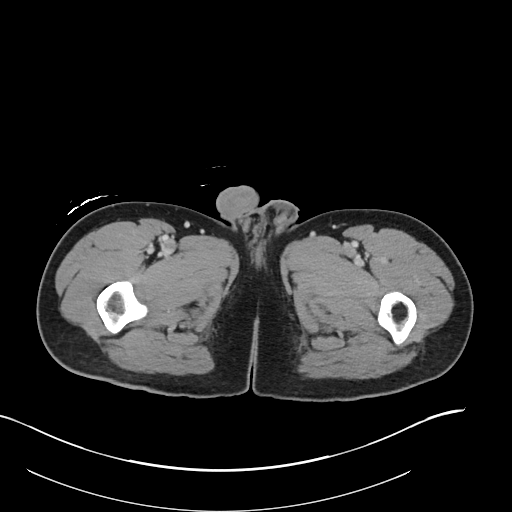
[im 4/97  bone]
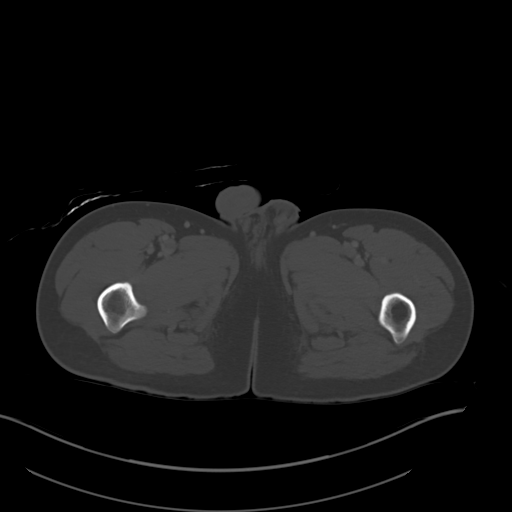
[im 12/97  soft-tissue]
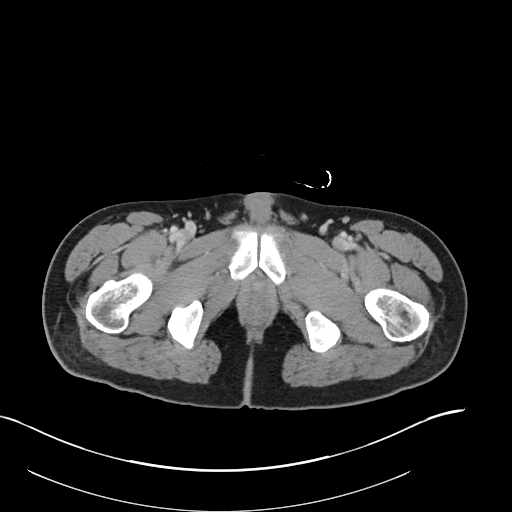
[im 19/97  soft-tissue]
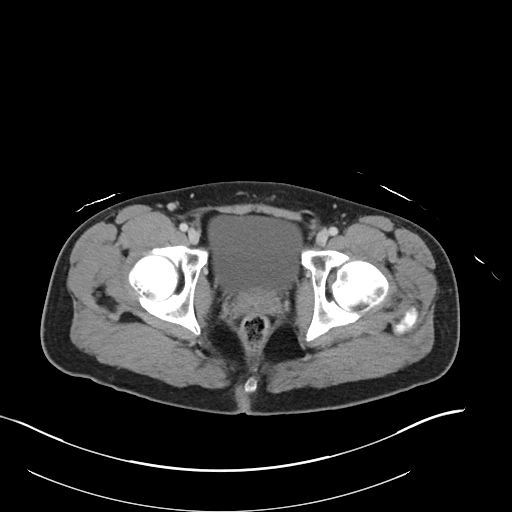
[im 26/97  soft-tissue]
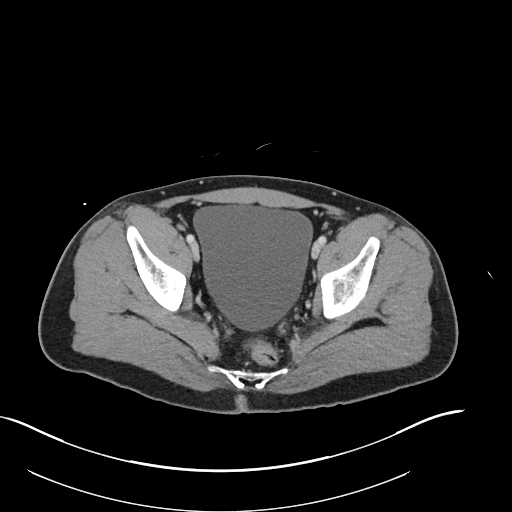
[im 34/97  soft-tissue]
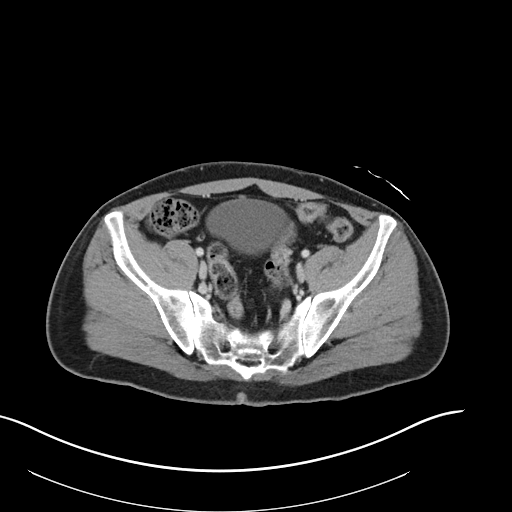
[im 41/97  soft-tissue]
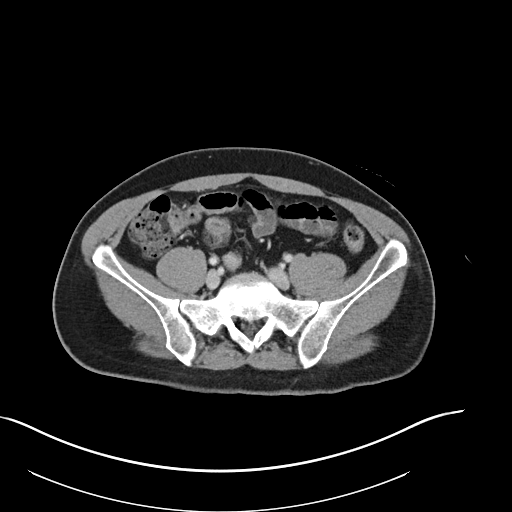
[im 49/97  soft-tissue]
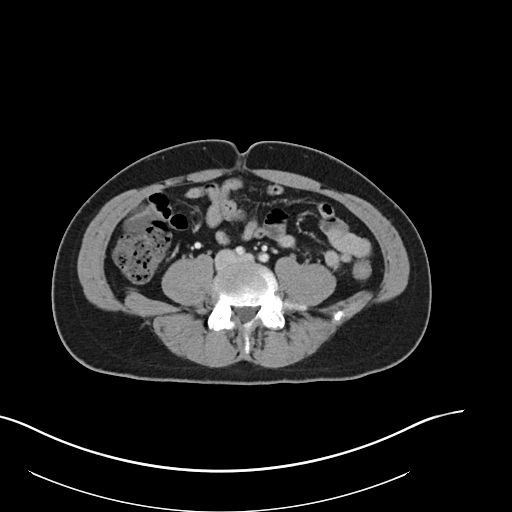
[im 56/97  soft-tissue]
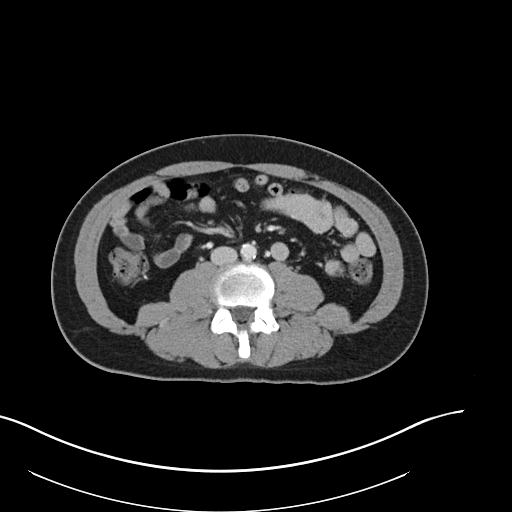
[im 63/97  soft-tissue]
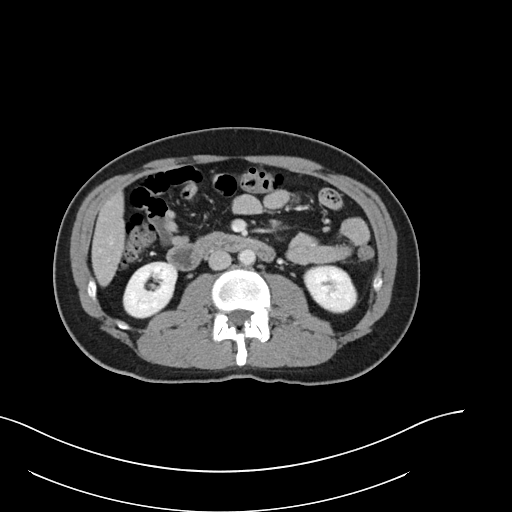
[im 63/97  bone]
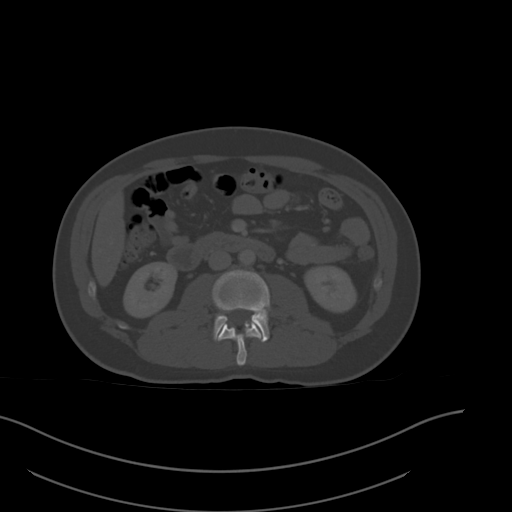
[im 71/97  soft-tissue]
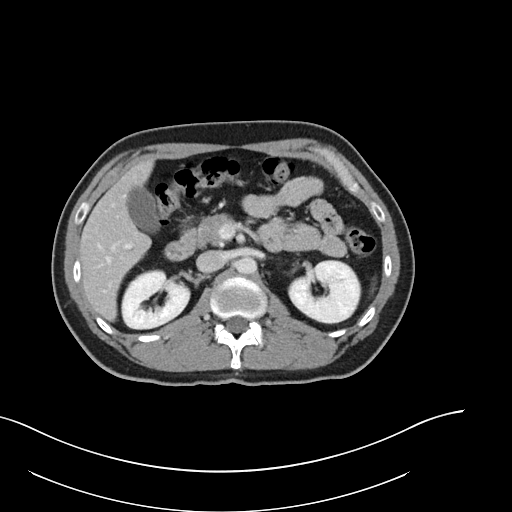
[im 78/97  soft-tissue]
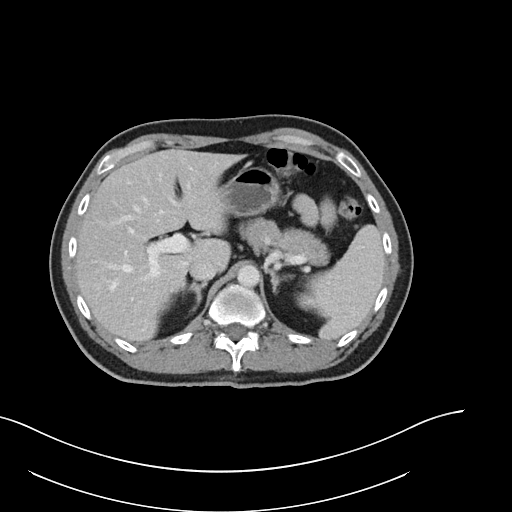
[im 85/97  soft-tissue]
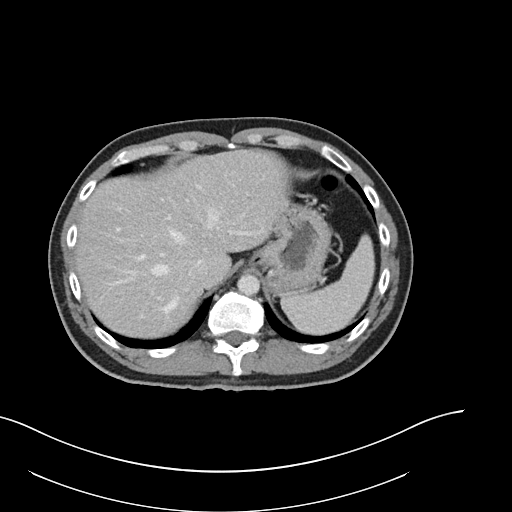
[im 93/97  soft-tissue]
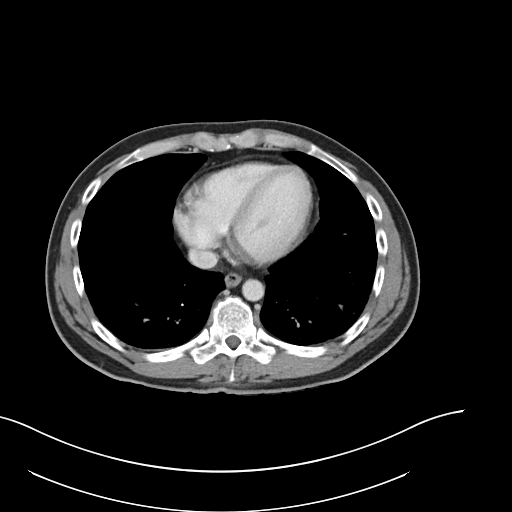

[Series 6: coronal soft tissue · coronal · 0.80mm/px · 3 of 101 slices shown]
[im 34/101  soft-tissue]
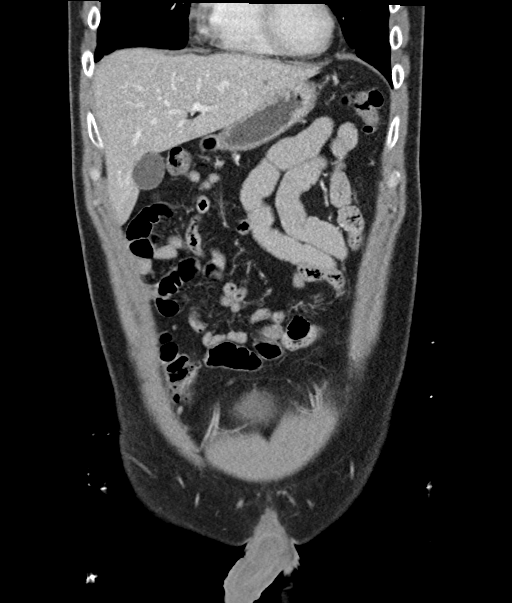
[im 45/101  soft-tissue]
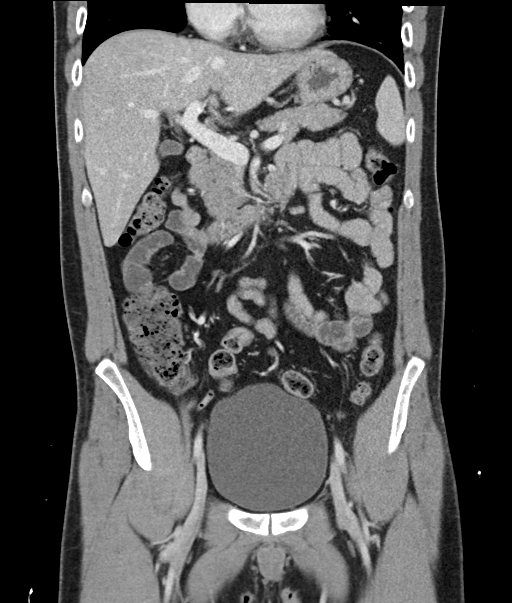
[im 56/101  soft-tissue]
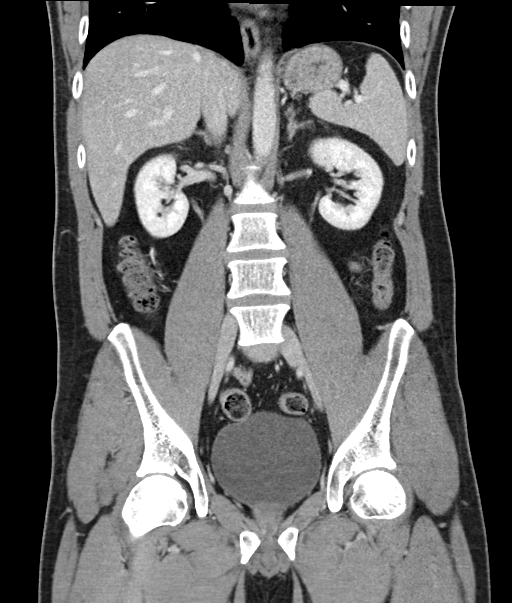

[16 of 46 positions shown; findings below may reference images not displayed]

RADIATION DOSE REDUCTION: This exam was performed according to the
departmental dose-optimization program which includes automated
exposure control, adjustment of the mA and/or kV according to
patient size and/or use of iterative reconstruction technique.

CONTRAST:  100mL OMNIPAQUE IOHEXOL 300 MG/ML  SOLN
FINDINGS: Lower chest: No acute abnormality.

Hepatobiliary: Subcentimeter hypodense lesions in the left lobe of
the liver are technically too small to accurately characterize but
statistically likely to reflect a benign etiology such as cysts or
hemangiomas. Gallbladder appears normal without cholelithiasis or
gallbladder wall thickening. No biliary ductal dilation.

Pancreas: No pancreatic ductal dilation or evidence of acute
inflammation.

Spleen: Normal in size without focal abnormality.

Adrenals/Urinary Tract: Bilateral adrenal glands appear normal. No
hydronephrosis. Kidneys demonstrate symmetric enhancement. Urinary
bladder is unremarkable.

Stomach/Bowel: No enteric contrast was administered. Stomach is
unremarkable for degree of distension. No pathologic dilation of
small or large bowel. The appendix and terminal ileum appear normal.
No evidence of acute bowel inflammation.

Vascular/Lymphatic: Aortic atherosclerosis without abdominal aortic
aneurysm.

Reproductive: Prostate is unremarkable.

Other: No abdominal wall hernia or abnormality. No abdominopelvic
ascites.

Musculoskeletal: No acute or significant osseous findings.
IMPRESSION: 1. No acute abnormality in the abdomen or pelvis.
2.  Aortic Atherosclerosis ([id number]).
